# Patient Record
Sex: Female | Born: 2000 | Race: White | Hispanic: Yes | Marital: Single
Health system: Southern US, Community
[De-identification: ages and names within clinical notes are randomized; demographics above are authoritative.]

---
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
## 2018-07-24 ENCOUNTER — Emergency Department (HOSPITAL_COMMUNITY): Payer: Medicaid Other

## 2018-07-24 ENCOUNTER — Other Ambulatory Visit: Payer: Self-pay

## 2018-07-24 ENCOUNTER — Emergency Department (HOSPITAL_COMMUNITY)
Admission: EM | Admit: 2018-07-24 | Discharge: 2018-07-25 | Disposition: A | Discharge status: Home / Self Care | Payer: Medicaid Other | Attending: Emergency Medicine | Admitting: Emergency Medicine

## 2018-07-24 DIAGNOSIS — R3915 Urgency of urination: Secondary | ICD-10-CM | POA: Insufficient documentation

## 2018-07-24 DIAGNOSIS — Z3202 Encounter for pregnancy test, result negative: Secondary | ICD-10-CM | POA: Diagnosis not present

## 2018-07-24 DIAGNOSIS — R1031 Right lower quadrant pain: Secondary | ICD-10-CM | POA: Diagnosis present

## 2018-07-24 DIAGNOSIS — M545 Low back pain: Secondary | ICD-10-CM | POA: Diagnosis not present

## 2018-07-24 DIAGNOSIS — R350 Frequency of micturition: Secondary | ICD-10-CM | POA: Diagnosis not present

## 2018-07-24 DIAGNOSIS — K5900 Constipation, unspecified: Secondary | ICD-10-CM | POA: Diagnosis not present

## 2018-07-24 DIAGNOSIS — R109 Unspecified abdominal pain: Secondary | ICD-10-CM

## 2018-07-24 LAB — URINALYSIS, ROUTINE W REFLEX MICROSCOPIC
Bilirubin Urine: NEGATIVE
GLUCOSE, UA: NEGATIVE mg/dL
Ketones, ur: NEGATIVE mg/dL
Leukocytes, UA: NEGATIVE
Nitrite: NEGATIVE
Protein, ur: NEGATIVE mg/dL
SPECIFIC GRAVITY, URINE: 1.006 (ref 1.005–1.030)
pH: 7 (ref 5.0–8.0)

## 2018-07-24 LAB — CBC
HCT: 42.2 % (ref 36.0–46.0)
Hemoglobin: 13.7 g/dL (ref 12.0–15.0)
MCH: 30.8 pg (ref 26.0–34.0)
MCHC: 32.5 g/dL (ref 30.0–36.0)
MCV: 94.8 fL (ref 80.0–100.0)
PLATELETS: 283 10*3/uL (ref 150–400)
RBC: 4.45 MIL/uL (ref 3.87–5.11)
RDW: 11.9 % (ref 11.5–15.5)
WBC: 9.6 10*3/uL (ref 4.0–10.5)
nRBC: 0 % (ref 0.0–0.2)

## 2018-07-24 LAB — COMPREHENSIVE METABOLIC PANEL
ALBUMIN: 4.5 g/dL (ref 3.5–5.0)
ALT: 19 U/L (ref 0–44)
AST: 23 U/L (ref 15–41)
Alkaline Phosphatase: 54 U/L (ref 38–126)
Anion gap: 11 (ref 5–15)
BILIRUBIN TOTAL: 0.6 mg/dL (ref 0.3–1.2)
BUN: 14 mg/dL (ref 6–20)
CO2: 24 mmol/L (ref 22–32)
CREATININE: 0.7 mg/dL (ref 0.44–1.00)
Calcium: 9.6 mg/dL (ref 8.9–10.3)
Chloride: 104 mmol/L (ref 98–111)
GFR calc Af Amer: >60 mL/min (ref >60)
GFR calc non Af Amer: >60 mL/min (ref >60)
GLUCOSE: 94 mg/dL (ref 70–99)
Potassium: 4.4 mmol/L (ref 3.5–5.1)
Sodium: 139 mmol/L (ref 135–145)
TOTAL PROTEIN: 7.8 g/dL (ref 6.5–8.1)

## 2018-07-24 LAB — LIPASE, BLOOD: Lipase: 32 U/L (ref 11–51)

## 2018-07-24 LAB — I-STAT BETA HCG BLOOD, ED (MC, WL, AP ONLY)

## 2018-07-24 MED ORDER — NAPROXEN 500 MG PO TABS
500.0000 mg | ORAL_TABLET | Freq: Once | ORAL | Status: AC
  Administered 2018-07-24: 500 mg via ORAL
  Filled 2018-07-24: qty 1

## 2018-07-24 NOTE — ED Triage Notes (Signed)
Pt complain lower abdominal pain and flank pain 3 days ago. Pt stated she was negative for UTI yesterday at urgent care.  Pt denies N/V/D.

## 2018-07-24 NOTE — ED Provider Notes (Signed)
[facility]-EMERGENCY DEPT Provider Note   CSN: [id number] Arrival date & time: 07/24/18  2029     History   Chief Complaint Chief Complaint  Patient presents with  . Abdominal Pain  . Flank Pain    HPI Kristy Ochoa is a 17 y.o. female.   17 year old female with no significant past medical history presents to the emergency department for evaluation of abdominal pain.  She has been experiencing lower abdominal pain for the past 3 days.  Symptoms are intermittent, sometimes aggravated when bending forward.  Her symptoms have been associated with urinary frequency and urgency.  She denies dysuria, nausea, vomiting, diarrhea, fevers, vaginal bleeding, vaginal discharge.  Her last menstrual period was 1 month ago.  Denies history of abdominal surgeries.  She has been experiencing some lower back pain, but this has been ongoing for approximately the past 2 months.  Denies any worsening of her back pain with her abdominal pain.     No past medical history on file.  There are no active problems to display for this patient.   ** The histories are not reviewed yet. Please review them in the "History" navigator section and refresh this SmartLink.   OB History   No obstetric history on file.      Home Medications    Prior to Admission medications   Medication Sig Start Date End Date Taking? Authorizing Provider  docusate sodium (COLACE) 100 MG capsule Take 1 capsule (100 mg total) by mouth every 12 (twelve) hours. 07/25/18   Antony Madura, PA-C  ibuprofen (ADVIL,MOTRIN) 600 MG tablet Take 1 tablet (600 mg total) by mouth every 6 (six) hours as needed. 07/25/18   Antony Madura, PA-C  polyethylene glycol powder (GLYCOLAX/MIRALAX) powder Take 17 g by mouth daily. Until daily soft stools  OTC 07/25/18   Antony Madura, PA-C    Family History No family history on file.  Social History Social History   Tobacco Use  . Smoking status: Not on file  Substance  Use Topics  . Alcohol use: Not on file  . Drug use: Not on file     Allergies   Patient has no known allergies.   Review of Systems Review of Systems Ten systems reviewed and are negative for acute change, except as noted in the HPI.    Physical Exam Updated Vital Signs BP 97/61   Pulse 87   Temp 98.2 F (36.8 C) (Oral)   Resp 16   Ht 4\' 11"  (1.499 m)   Wt 44.5 kg   LMP 06/24/2018 (Approximate) Comment: neg preg test  SpO2 98%   BMI 19.79 kg/m   Physical Exam Vitals signs and nursing note reviewed.  Constitutional:      General: She is not in acute distress.    Appearance: She is well-developed. She is not diaphoretic.     Comments: Nontoxic appearing and in no acute distress  HENT:     Head: Normocephalic and atraumatic.  Eyes:     General: No scleral icterus.    Conjunctiva/sclera: Conjunctivae normal.  Neck:     Musculoskeletal: Normal range of motion.  Cardiovascular:     Rate and Rhythm: Regular rhythm.     Pulses: Normal pulses.  Pulmonary:     Effort: Pulmonary effort is normal. No respiratory distress.     Breath sounds: No wheezing.     Comments: Respirations even and unlabored Abdominal:     Palpations: There is no mass.  Tenderness: There is abdominal tenderness.     Hernia: No hernia is present.     Comments: Soft abdomen with mild right lower quadrant tenderness.  No peritoneal signs or guarding.  No palpable masses.  Musculoskeletal: Normal range of motion.  Skin:    General: Skin is warm and dry.     Coloration: Skin is not pale.     Findings: No erythema or rash.  Neurological:     Mental Status: She is alert and oriented to person, place, and time.     Comments: GCS 15.  Moving all extremities spontaneously.  Psychiatric:        Mood and Affect: Mood is anxious.        Behavior: Behavior normal.      ED Treatments / Results  Labs (all labs ordered are listed, but only abnormal results are displayed) Labs Reviewed    URINALYSIS, ROUTINE W REFLEX MICROSCOPIC - Abnormal; Notable for the following components:      Result Value   Color, Urine COLORLESS (*)    Hgb urine dipstick SMALL (*)    Bacteria, UA RARE (*)    All other components within normal limits  LIPASE, BLOOD  COMPREHENSIVE METABOLIC PANEL  CBC  I-STAT BETA HCG BLOOD, ED ([facility], [facility], [facility] ONLY)    EKG None  Radiology Dg Abd 2 Views  Result Date: 07/24/2018 CLINICAL DATA:  Lower abdominal and flank pain since 3 days ago. EXAM: ABDOMEN - 2 VIEW COMPARISON:  None. FINDINGS: Moderate stool retention within the cecum and right colon to the proximal transverse colon. No bowel obstruction. No organomegaly, radiopaque foreign body nor free air. No acute osseous abnormality. IMPRESSION: Moderate stool retention within the cecum and right colon. Electronically Signed   By: Tollie Eth M.D.   On: 07/24/2018 23:05    Procedures Procedures (including critical care time)  Medications Ordered in ED Medications  naproxen (NAPROSYN) tablet 500 mg (500 mg Oral Given 07/24/18 2358)     Initial Impression / Assessment and Plan / ED Course  I have reviewed the triage vital signs and the nursing notes.  Pertinent labs & imaging results that were available during my care of the patient were reviewed by me and considered in my medical decision making (see chart for details).     17 year old female presents to the emergency department for evaluation of intermittent lower abdominal pain.  She has very mild right lower quadrant tenderness on palpation of the abdomen.  No peritoneal signs or rigidity.  No guarding.  Her symptoms are atypical for appendicitis given absence of fever, vomiting, and constant/worsening pain.  Her evaluation today is just of of constipation on x-ray.  Moderate stool retention is specifically seen in the cecum in the right colon correlating with area of symptoms.  Labs are reassuring without leukocytosis or electrolyte derangements.   Liver and kidney function preserved.  She has a negative pregnancy test and urinalysis does not suggest UTI.  Her repeat abdominal exam is stable.  I have a low suspicion for emergent intra-abdominal process.  I have counseled the patient on use of anti-inflammatories for pain.  Will start on Colace and MiraLAX for constipation management.  Encouraged to follow-up with her primary care doctor for repeat evaluation.  Return precautions discussed and provided. Patient discharged in stable condition with no unaddressed concerns.   Final Clinical Impressions(s) / ED Diagnoses   Final diagnoses:  Abdominal pain  Constipation, unspecified constipation type    ED Discharge Orders  Ordered    ibuprofen (ADVIL,MOTRIN) 600 MG tablet  Every 6 hours PRN     07/25/18 0128    docusate sodium (COLACE) 100 MG capsule  Every 12 hours     07/25/18 0128    polyethylene glycol powder (GLYCOLAX/MIRALAX) powder  Daily     07/25/18 0128           Antony MaduraHumes, Morgin Halls, PA-C 07/25/18 0557    Pricilla LovelessGoldston, Scott, MD 07/27/18 [id number]

## 2018-07-25 MED ORDER — IBUPROFEN 600 MG PO TABS: 600.0000 mg | ORAL_TABLET | Freq: Four times a day (QID) | ORAL | 0 refills | Status: AC | PRN

## 2018-07-25 MED ORDER — DOCUSATE SODIUM 100 MG PO CAPS: 100.0000 mg | ORAL_CAPSULE | Freq: Two times a day (BID) | ORAL | 0 refills | Status: AC

## 2018-07-25 MED ORDER — POLYETHYLENE GLYCOL 3350 17 GM/SCOOP PO POWD: 17.0000 g | Freq: Every day | ORAL | 0 refills | Status: AC

## 2018-07-25 NOTE — Discharge Instructions (Signed)
We recommend Colace and MiraLAX for improvement of constipation.  You may take ibuprofen for management of pain.  Drink plenty of water to prevent dehydration.  Follow-up with your primary doctor.
